# Patient Record
Sex: Male | Born: 1980 | Race: Black or African American | Hispanic: No
Health system: Midwestern US, Community
[De-identification: ages and names within clinical notes are randomized; demographics above are authoritative.]

## PROBLEM LIST (undated history)

## (undated) DIAGNOSIS — J45909 Unspecified asthma, uncomplicated: Secondary | ICD-10-CM

---

## 2018-07-02 LAB — POCT TROPONIN: POC Troponin I: 0 ng/mL (ref 0.00–0.08)

## 2019-12-18 LAB — CBC WITH AUTO DIFFERENTIAL
Absolute Baso #: 0 10*3/uL (ref 0.0–0.2)
Absolute Eos #: 0.2 10*3/uL (ref 0.0–0.5)
Absolute Lymph #: 1.8 10*3/uL (ref 1.0–3.2)
Absolute Mono #: 0.4 10*3/uL (ref 0.3–1.0)
Basophils %: 0.6 % (ref 0.0–2.0)
Eosinophils %: 3.3 % (ref 0.0–7.0)
Hematocrit: 42.5 % (ref 38.0–52.0)
Hemoglobin: 14.8 g/dL (ref 13.0–17.3)
Immature Grans (Abs): 0.01 10*3/uL (ref 0.00–0.06)
Immature Granulocytes: 0.2 % (ref 0.1–0.6)
Lymphocytes: 37 % (ref 15.0–45.0)
MCH: 32.7 pg (ref 27.0–34.5)
MCHC: 34.8 g/dL (ref 32.0–36.0)
MCV: 94 fL (ref 84.0–100.0)
MPV: 8.9 fL (ref 7.2–13.2)
Monocytes: 8.9 % (ref 4.0–12.0)
NRBC Absolute: 0 10*3/uL (ref 0.000–0.012)
NRBC Automated: 0 % (ref 0.0–0.2)
Neutrophils %: 50 % (ref 42.0–74.0)
Neutrophils Absolute: 2.5 10*3/uL (ref 1.6–7.3)
Platelets: 256 10*3/uL (ref 140–440)
RBC: 4.52 x10e6/mcL (ref 4.00–5.60)
RDW: 12.4 % (ref 11.0–16.0)
WBC: 4.9 10*3/uL (ref 3.8–10.6)

## 2019-12-18 LAB — BASIC METABOLIC PANEL
Anion Gap: 13 mmol/L (ref 2–17)
BUN: 8 mg/dL (ref 6–20)
CO2: 21 mmol/L — ABNORMAL LOW (ref 22–29)
Calcium: 8.8 mg/dL (ref 8.6–10.0)
Chloride: 108 mmol/L — ABNORMAL HIGH (ref 98–107)
Creatinine: 0.8 mg/dL (ref 0.7–1.3)
GFR African American: 131 mL/min/{1.73_m2} (ref >=90)
GFR Non-African American: 113 mL/min/{1.73_m2} (ref >=90)
Glucose: 72 mg/dL (ref 70–99)
OSMOLALITY CALCULATED: 280 mOsm/kg (ref 270–287)
Potassium: 3.9 mmol/L (ref 3.5–5.3)
Sodium: 142 mmol/L (ref 135–145)

## 2019-12-18 LAB — POC URINALYSIS, CHEMISTRY
Blood, UA POC: NEGATIVE
Glucose, UA POC: NEGATIVE mg/dL
Leukocytes, UA: NEGATIVE
Nitrate, UA POC: NEGATIVE
Specific Gravity, Urine, POC: >=1.03 — AB (ref 1.003–1.035)
UROBILIN U POC: 1 EU/dL
pH, Urine, POC: 5.5 (ref 4.5–8.0)

## 2019-12-18 LAB — HEPATIC FUNCTION PANEL
ALT: 41 U/L (ref 0–41)
AST: 40 U/L (ref 0–40)
Albumin: 4.7 g/dL (ref 3.5–5.2)
Alk Phosphatase: 52 U/L (ref 40–130)
Bilirubin, Direct: <0.2 mg/dL (ref 0.00–0.30)
Total Bilirubin: 0.4 mg/dL (ref 0.00–1.20)
Total Protein: 7.6 g/dL (ref 6.4–8.3)

## 2019-12-18 LAB — LIPASE: Lipase: 19 U/L (ref 13–60)

## 2019-12-18 NOTE — ED Provider Notes (Signed)
General Medical Problem *ED        Patient:   Lucas Bird, Lucas Bird             MRN: [id number]            FIN: [id number]               Age:   39 years     Sex:  Male     DOB:  [birth year]   Associated Diagnoses:   Abdominal pain; Alcoholic gastritis   Author:   Zeinab Rodwell-MD,  Shari Natt      Basic Information   Additional information: Chief Complaint from Nursing Triage Note   Chief Complaint  Chief Complaint: pt c/o burning abd pain radiating to the chest starting last night. denies n/v/d (12/18/19 09:05:00).      History of Present Illness   This is a 39 year old male with chief complaint of abdominal pain.  Patient states he is having some burning abdominal pain epigastric into his chest that started last night.  No vomiting.  No back pain.  No true chest pain.  No shortness of breath.  No fever.  He is somewhat of a drinker.  No abdominal surgeries.  No medicines..        Review of Systems   Constitutional symptoms:  No fever,    Respiratory symptoms:  No shortness of breath,    Cardiovascular symptoms:  No chest pain,    Gastrointestinal symptoms:  Abdominal pain, epigastric, burning.              Additional review of systems information: All other systems reviewed and otherwise negative.      Health Status   Allergies:    Allergic Reactions (Selected)  No Known Medication Allergies.      Past Medical/ Family/ Social History   Medical history: Reviewed as documented in chart.   Surgical history:    None ([id number])., Reviewed as documented in chart.   Family history: Reviewed as documented in chart.   Social history:    Social & Psychosocial Habits    Tobacco  11/04/2016  Use: Current every day smoker  , Reviewed as documented in chart.   Problem list:    Active Problems (1)  No Chronic Problems   .      Physical Examination               Vital Signs   Vital Signs   12/18/2019 9:08 EST Systolic Blood Pressure 119 mmHg    Diastolic Blood Pressure 85 mmHg    Peripheral Pulse Rate 66 bpm    Heart Rate Monitored 66 bpm     Respiratory Rate 20 br/min    Mean Arterial Pressure, Cuff 98 mmHg    SpO2 100 %   12/18/2019 9:05 EST Systolic Blood Pressure 119 mmHg    Diastolic Blood Pressure 85 mmHg    Temperature Oral 36.5 degC    Heart Rate Monitored 61 bpm    Respiratory Rate 18 br/min    SpO2 100 %   .   Measurements   12/18/2019 9:08 EST Body Mass Index est meas 19.44 kg/m2    Body Mass Index Measured 19.44 kg/m2   12/18/2019 9:05 EST Height/Length Measured 180 cm    Weight Dosing 63 kg   .   Basic Oxygen Information   12/18/2019 9:08 EST SpO2 100 %   12/18/2019 9:05 EST Oxygen Therapy Room air    SpO2 100 %   .   General:  Alert, no acute distress.    Skin:  Warm, dry, pink.    Head:  Normocephalic.   Neck:  Supple, trachea midline.    Eye:  Pupils are equal, round and reactive to light, extraocular movements are intact.    Ears, nose, mouth and throat:  Oral mucosa moist.   Cardiovascular:  Regular rate and rhythm.   Respiratory:  Lungs are clear to auscultation, respirations are non-labored.    Back:  Normal range of motion.   Musculoskeletal:  Normal ROM, no tenderness.    Chest wall   Gastrointestinal:  Soft, Non distended, Mild epigastric tenderness.  No rebound or guarding..    Neurological:  Alert and oriented to person, place, time, and situation, No focal neurological deficit observed, normal motor observed.    Lymphatics:  No lymphadenopathy.   Psychiatric:  Cooperative, appropriate mood & affect.       Medical Decision Making   Electrocardiogram:  Emergency Provider interpretation performed by me, normal sinus rhythm, No ST-T changes.       Reexamination/ Reevaluation   Vital signs   Basic Oxygen Information   12/18/2019 9:08 EST SpO2 100 %   12/18/2019 9:05 EST Oxygen Therapy Room air    SpO2 100 %      Notes: Patient feels better.  He is resting comfortably.  Repeat abdominal exam soft nontender.  He is advised to avoid excessive alcohol intake..      Impression and Plan   Diagnosis   Abdominal pain (ICD10-CM R10.9, Discharge,  Medical)   Alcoholic gastritis (YWV37-TG K29.20, Discharge, Medical)   Plan   Condition: Improved, Stable.    Disposition: Discharged: time  12/18/2019 11:59:00.    Prescriptions: Launch prescriptions   Pharmacy:  Pepcid 20 mg oral tablet (Prescribe): 20 mg, 1 tabs, Oral, BID, for 7 days, 14 tabs, 0 Refill(s).    Patient was given the following educational materials: Gastritis, Adult, Easy-to-Read, Gastritis, Adult, Easy-to-Read.    Follow up with: Follow up with primary care provider Within 1 week Return to ED if symptoms worsen.    Counseled: Patient, Regarding diagnosis, Regarding diagnostic results, Regarding treatment plan, Regarding prescription, Patient indicated understanding of instructions.    Signature Line     Electronically Signed on 12/18/2019 12:00 PM EST   ________________________________________________   Nalaya Wojdyla-MD,  Hannalee Castor               Modified by: Cheryllynn Sarff-MD,  Jabari Swoveland on 12/18/2019 12:00 PM EST

## 2019-12-18 NOTE — ED Notes (Signed)
ED Triage Note       ED Secondary Triage Entered On:  12/18/2019 9:08 EST    Performed On:  12/18/2019 9:08 EST by Ladona Ridgel, RN, JENNIFER S               General Information   Barriers to Learning :   None evident   ED Home Meds Section :   Document assessment   [facility] ED Fall Risk Section :   Document assessment   ED History Section :   Document assessment   ED Advance Directives Section :   Document assessment   ED Palliative Screen :   N/A (prefilled for <65yo)   Ladona Ridgel, RN, JENNIFER S - 12/18/2019 9:08 EST   (As Of: 12/18/2019 09:08:52 EST)   Problems(Active)    No Chronic Problems (Cerner  :NKP )  Name of Problem:   No Chronic Problems ; Recorder:   Sandre Kitty, RN, Johnney Killian; Code:   NKP ; Last Updated:   11/04/2016 14:16 EST ; Life Cycle Date:   11/04/2016 ; Life Cycle Status:   Active ; Vocabulary:   Cerner          Diagnoses(Active)    Abdominal pain  Date:   12/18/2019 ; Diagnosis Type:   Reason For Visit ; Confirmation:   Complaint of ; Clinical Dx:   Abdominal pain ; Classification:   Medical ; Clinical Service:   Emergency medicine ; Code:   PNED ; Probability:   0 ; Diagnosis Code:   [id number]             -    Procedure History   (As Of: 12/18/2019 09:08:52 EST)     Anesthesia Minutes:   0 ; Procedure Name:   None ; Procedure Minutes:   0            [facility] Fall Risk Assessment Tool   Hx of falling last 3 months ED Fall :   No   Laruth Bouchard - 12/18/2019 9:08 EST   ED Advance Directive   Advance Directive :   No   Ladona Ridgel RN, JENNIFER S - 12/18/2019 9:08 EST   Social History   Social History   (As Of: 12/18/2019 09:08:52 EST)   Tobacco:        Current every day smoker   (Last Updated: 11/04/2016 14:16:39 EST by Sandre Kitty, RN, ANDREA J)            Med Hx   Medication List   (As Of: 12/18/2019 09:08:52 EST)

## 2019-12-18 NOTE — ED Notes (Signed)
ED Patient Education Note     Patient Education Materials Follows:  Easy-to-Read     Gastritis, Adult    Gastritis is soreness and puffiness (inflammation) of the lining of the stomach. If you do not get help, gastritis can cause bleeding and sores (ulcers) in the stomach.      HOME CARE     Only take medicine as told by your doctor.     If you were given antibiotic medicines, take them as told. Finish the medicines even if you start to feel better.      Drink enough fluids to keep your pee (urine) clear or pale yellow.      Avoid foods and drinks that make your problems worse. Foods you may want to avoid include:    ? Caffeine or alcohol.    ? Chocolate.    ? Mint.    ? Garlic and onions.    ? Spicy foods.    ? Citrus fruits, including oranges, lemons, or limes.    ? Food containing tomatoes, including sauce, chili, salsa, and pizza.    ? Fried and fatty foods.     Eat small meals throughout the day instead of large meals.    GET HELP RIGHT AWAY IF:     You have black or dark red poop (stools).     You throw up (vomit) blood. It may look like coffee grounds.     You cannot keep fluids down.     Your belly (abdominal) pain gets worse.     You have a fever.     You do not feel better after 1 week.      You have any other questions or concerns.    MAKE SURE YOU:     Understand these instructions.      Will watch your condition.     Will get help right away if you are not doing well or get worse.    This information is not intended to replace advice given to you by your health care provider. Make sure you discuss any questions you have with your health care provider.    Document Released: 03/31/2008 Document Revised: 01/05/2012 Document Reviewed: 11/26/2011  Elsevier Interactive Patient Education ?2016 Elsevier Inc.

## 2019-12-18 NOTE — ED Notes (Signed)
ED Patient Education Note     Patient Education Materials Follows:  Easy-to-Read     Gastritis, Adult    Gastritis is soreness and puffiness (inflammation) of the lining of the stomach. If you do not get help, gastritis can cause bleeding and sores (ulcers) in the stomach.      HOME CARE     Only take medicine as told by your doctor.     If you were given antibiotic medicines, take them as told. Finish the medicines even if you start to feel better.      Drink enough fluids to keep your pee (urine) clear or pale yellow.      Avoid foods and drinks that make your problems worse. Foods you may want to avoid include:    ? Caffeine or alcohol.    ? Chocolate.    ? Mint.    ? Garlic and onions.    ? Spicy foods.    ? Citrus fruits, including oranges, lemons, or limes.    ? Food containing tomatoes, including sauce, chili, salsa, and pizza.    ? Fried and fatty foods.     Eat small meals throughout the day instead of large meals.    GET HELP RIGHT AWAY IF:     You have black or dark red poop (stools).     You throw up (vomit) blood. It may look like coffee grounds.     You cannot keep fluids down.     Your belly (abdominal) pain gets worse.     You have a fever.     You do not feel better after 1 week.      You have any other questions or concerns.    MAKE SURE YOU:     Understand these instructions.      Will watch your condition.     Will get help right away if you are not doing well or get worse.    This information is not intended to replace advice given to you by your health care provider. Make sure you discuss any questions you have with your health care provider.    Document Released: 03/31/2008 Document Revised: 01/05/2012 Document Reviewed: 11/26/2011  Elsevier Interactive Patient Education ?2016 Elsevier Inc.

## 2019-12-18 NOTE — ED Notes (Signed)
ED Triage Note       ED Triage Adult Entered On:  12/18/2019 9:08 EST    Performed On:  12/18/2019 9:05 EST by Lovena Le, RN, JENNIFER S               Triage   Chief Complaint :   pt c/o burning abd pain radiating to the chest starting last night. denies n/v/d   Numeric Rating Pain Scale :   8   [place] Mode of Arrival :   Private vehicle   Infectious Disease Documentation :   Document assessment   Temperature Oral :   36.5 degC(Converted to: 97.7 degF)    Heart Rate Monitored :   61 bpm   Respiratory Rate :   18 br/min   Systolic Blood Pressure :   601 mmHg   Diastolic Blood Pressure :   85 mmHg   SpO2 :   100 %   Oxygen Therapy :   Room air   Patient presentation :   None of the above   Chief Complaint or Presentation suggest infection :   No   Weight Dosing :   63 kg(Converted to: 138 lb 14 oz)    Height :   180 cm(Converted to: 5 ft 11 in)    Body Mass Index Dosing :   19 kg/m2   Lovena Le RN, [place] S - 12/18/2019 9:05 EST   DCP GENERIC CODE   Tracking Acuity :   3   Tracking Group :   ED [employer], RN, [employer] S - 12/18/2019 9:05 EST   ED General Section :   Document assessment   Pregnancy Status :   N/A   ED Allergies Section :   Document assessment   ED Reason for Visit Section :   Document assessment   ED Quick Assessment :   Patient appears awake, alert, oriented to baseline. Skin warm and dry. Moves all extremities. Respiration even and unlabored. Appears in no apparent distress.   Lovena Le, RN, [place] S - 12/18/2019 9:05 EST   ID Risk Screen Symptoms   Recent Travel History :   No recent travel   Close Contact with COVID-19 ID :   No   Last 14 days COVID-19 ID :   Yes - Not Detected (negative)   TAYLOR, RN, JENNIFER S - 12/18/2019 9:05 EST   Allergies   (As Of: 12/18/2019 09:08:17 EST)   Allergies (Active)   No Known Medication Allergies  Estimated Onset Date:   Unspecified ; Created ByRebeca Alert, RN, Maralyn Sago; Reaction Status:   Active ; Category:   Drug ; Substance:   No Known Medication  Allergies ; Type:   Allergy ; Updated By:   Acquanetta Chain; Reviewed Date:   07/02/2018 16:58 EDT        Psycho-Social   Last 3 mo, thoughts killing self/others :   Patient denies   Unknown Foley - 12/18/2019 9:05 EST   ED Reason for Visit   (As Of: 12/18/2019 09:08:17 EST)   Problems(Active)    No Chronic Problems (Cerner  :NKP )  Name of Problem:   No Chronic Problems ; Recorder:   Rebeca Alert, RN, Maralyn Sago; Code:   NKP ; Last Updated:   11/04/2016 14:16 EST ; Life Cycle Date:   11/04/2016 ; Life Cycle Status:   Active ; Vocabulary:   Cerner  Diagnoses(Active)    Abdominal pain  Date:   12/18/2019 ; Diagnosis Type:   Reason For Visit ; Confirmation:   Complaint of ; Clinical Dx:   Abdominal pain ; Classification:   Medical ; Clinical Service:   Emergency medicine ; Code:   PNED ; Probability:   0 ; Diagnosis Code:   [id number]

## 2019-12-18 NOTE — ED Notes (Signed)
 ED Patient Summary       ;       [facility] Emergency Department  [address] [place], [place] [address]  [phone]  Discharge Instructions (Patient)  _______________________________________     Name: Lucas Bird, Lucas Bird  DOB:  12/16/1980                   MRN: [id number]                   FIN: [id number]  Reason For Visit: Abdominal pain; CHEST/ABD PAIN  Final Diagnosis: Abdominal pain; Alcoholic gastritis     Visit Date: 12/18/2019 08:57:00  Address: [address] [place] [address]  Phone: [phone]     Emergency Department Providers:         Primary Physician:   [facility] would like to thank you for allowing us  to assist you with your healthcare needs. The following includes patient education materials and information regarding your injury/illness.     Follow-up Instructions:  You were seen today on an emergency basis. Please contact your primary care doctor for a follow up appointment. If you received a referral to a specialist doctor, it is important you follow-up as instructed.    It is important that you call your follow-up doctor to schedule and confirm the location of your next appointment. Your doctor may practice at multiple locations. The office location of your follow-up appointment may be different to the one written on your discharge instructions.    If you do not have a primary care doctor, please call [phone] for help in finding a [facility] Provider. For help in finding a specialist doctor, please call [phone].    The [facility] "Ask a Nurse" line in staffed by Registered Nurses and is a free service to the community. We are available Monday - Friday from 8am to 5pm to answer your questions about your health. Please call [phone].    If your condition gets worse before your follow-up with your primary care doctor or specialist, please return to the Emergency  Department.      Coronavirus 2019 (COVID-19) Reminders:     Patients 65 and older can contact their [facility] doctors' offices to schedule appointments to receive the COVID-19 vaccine at the [facility] or send us  an email at [email or web address].            Scan this code with your phone camera to send an email to the address above.      Patients who are 78 and older who do not have a [facility] physician can call [phone] to schedule vaccination appointments.         Follow Up Appointments:  Primary Care Provider:      Name: PCP,  NONE      Phone:                  With: Address: When:   Follow up with primary care provider  Within 1 week   Comments:   Return to ED if symptoms worsen              Printed Prescriptions:    Patient Education Materials:  Discharge Orders          Discharge Patient 12/18/19 12:00:00 EST         Comment:  Gastritis, Adult, Easy-to-Read     Gastritis, Adult    Gastritis is soreness and puffiness (inflammation) of the lining of the stomach. If you do not get help, gastritis can cause bleeding and sores (ulcers) in the stomach.      HOME CARE     Only take medicine as told by your doctor.     If you were given antibiotic medicines, take them as told. Finish the medicines even if you start to feel better.      Drink enough fluids to keep your pee (urine) clear or pale yellow.      Avoid foods and drinks that make your problems worse. Foods you may want to avoid include:    ? Caffeine or alcohol.    ? Chocolate.    ? Mint.    ? Garlic and onions.    ? Spicy foods.    ? Citrus fruits, including oranges, lemons, or limes.    ? Food containing tomatoes, including sauce, chili, salsa, and pizza.    ? Fried and fatty foods.     Eat small meals throughout the day instead of large meals.    GET HELP RIGHT AWAY IF:     You have black or dark red poop (stools).     You throw up (vomit) blood. It may look like coffee grounds.     You cannot keep  fluids down.     Your belly (abdominal) pain gets worse.     You have a fever.     You do not feel better after 1 week.      You have any other questions or concerns.    MAKE SURE YOU:     Understand these instructions.      Will watch your condition.     Will get help right away if you are not doing well or get worse.    This information is not intended to replace advice given to you by your health care provider. Make sure you discuss any questions you have with your health care provider.    Document Released: 03/31/2008 Document Revised: 01/05/2012 Document Reviewed: 11/26/2011  Elsevier Interactive Patient Education ?2016 Elsevier Inc.         Allergy Info: No Known Medication Allergies     Medication Information:  [facility] ED Physicians provided you with a complete list of medications post discharge, if you have been instructed to stop taking a medication please ensure you also follow up with this information to your Primary Care Physician.  Unless otherwise noted, patient will continue to take medications as prescribed prior to the Emergency Room visit.  Any specific questions regarding your chronic medications and dosages should be discussed with your physician(s) and pharmacist.          famotidine (Pepcid 20 mg oral tablet) 1 Tabs Oral (given by mouth) 2 times a day for 7 Days. Refills: 0.      Medications Administered During Visit:              Medication Dose Route   Sodium Chloride 0.9% 1000 mL IV Piggyback   pantoprazole 40 mg IV Push   lidocaine topical 15 mL Oral   Al hydroxide/Mg hydroxide/simethicone 30 mL Oral   metoclopramide 10 mg IV Push          Major Tests and Procedures:  The following procedures and tests were performed during your ED visit.  COMMON PROCEDURES%>  COMMON PROCEDURES COMMENTS%>  Laboratory Orders  Name Status Details   .UA POC Completed Urine, RT, RT - Routine, Collected, 12/18/19 9:47:00 EST, Nurse collect, 12/18/19 9:47:00 US Robinette, RAL POC Login   BMP  Completed Blood, Stat, ST - Stat, 12/18/19 9:20:00 EST, 12/18/19 9:20:00 EST, Nurse collect, DALU-MD,  DAVID, Print label Y/N   CBCDIFF Completed Blood, Stat, ST - Stat, 12/18/19 9:20:00 EST, 12/18/19 9:20:00 EST, Nurse collect, DALU-MD,  DAVID, Print label Y/N   Hepatic Completed Blood, Stat, ST - Stat, 12/18/19 9:20:00 EST, 12/18/19 9:20:00 EST, Nurse collect, DALU-MD,  DAVID, Print label Y/N   Lipase Lvl Completed Blood, Stat, ST - Stat, 12/18/19 9:20:00 EST, 12/18/19 9:20:00 EST, Nurse collect, DALU-MD,  DAVID, Print label Y/N   XTube Blue Completed Blood, Stat, ST - Stat, Collected, 12/18/19 9:24:00 EST [id number], 12/18/19 9:24:00 EST, Nurse collect, Venous Draw, 12/18/19 9:33:00 EST, RH CP Login, DALU-MD,  DAVID, Print label Y/N, rh_accession_1, 1.8 mL Aolz/*[id number]*/, Complete               Radiology Orders  Name Status Details   XR Chest 1 View Portable Completed 12/18/19 10:49:00 EST, STAT 1 hour or less, Reason: Chest pain, Transport Mode: Portable, pp_set_radiology_subspecialty               Patient Care Orders  Name Status Details   Discharge Patient Ordered 12/18/19 12:00:00 EST   ED Assessment Adult Completed 12/18/19 9:08:18 EST, 12/18/19 9:08:18 EST   ED Secondary Triage Completed 12/18/19 9:08:18 EST, 12/18/19 9:08:18 EST   ED Triage Adult Completed 12/18/19 8:57:53 EST, 12/18/19 8:57:53 EST   POC-Urine Dipstick collect Completed 12/18/19 9:20:00 EST, Once, 12/18/19 9:20:00 EST   Saline Lock Insert Completed 12/18/19 9:20:00 EST, Once, 12/18/19 9:20:00 EST       ---------------------------------------------------------------------------------------------------------------------  [facility] encourages you to self-enroll in the [facility] Patient Portal.  [facility] Patient Portal will allow you to manage your personal health information securely from your own electronic device now and in the future.  To begin your Patient Portal enrollment process, please visit  [email or web address] Click on "Sign up now" under [facility].  If you find that you need additional assistance on the [facility] Patient Portal or need a copy of your medical records, please call the [facility] Medical Records Office at [phone].  Comment:

## 2019-12-18 NOTE — Discharge Summary (Signed)
ED Clinical Summary                        [facility]  [address]  [place], [place] [address]  [phone]           PERSON INFORMATION  Name: Lucas Bird, Lucas Bird Age:  39 Years DOB: 1980-11-20   Sex: Male Language: English PCP: PCP,  NONE   Marital Status: Single Phone: [phone] Med Service: MED-Medicine   MRN: [id number] Acct# [id number] Arrival: 12/18/2019 08:57:00   Visit Reason: Abdominal pain; CHEST/ABD PAIN Acuity: 3 LOS: 000 03:09   Address:    [address] [place] [address]   Diagnosis:    Abdominal pain; Alcoholic gastritis  Medications:          New Medications  Printed Prescriptions  famotidine (Pepcid 20 mg oral tablet) 1 Tabs Oral (given by mouth) 2 times a day for 7 Days. Refills: 0.  Last Dose:____________________      Medications Administered During Visit:                Medication Dose Route   Sodium Chloride 0.9% 1000 mL IV Piggyback   pantoprazole 40 mg IV Push   lidocaine topical 15 mL Oral   Al hydroxide/Mg hydroxide/simethicone 30 mL Oral   metoclopramide 10 mg IV Push               Allergies      No Known Medication Allergies      Major Tests and Procedures:  The following procedures and tests were performed during your ED visit.  COMMON PROCEDURES%>  COMMON PROCEDURES COMMENTS%>                PROVIDER INFORMATION               Provider Role Assigned Daymon Larsen, DAVID ED Provider 12/18/2019 08:58:30    Ladona Ridgel, RN, Richarda Osmond ED Nurse 12/18/2019 09:10:41        Attending Physician:  DALU-MD,  DAVID      Admit Doc  DALU-MD,  DAVID     Consulting Doc       VITALS INFORMATION  Vital Sign Triage Latest   Temp Oral ORAL_1%> ORAL%>   Temp Temporal TEMPORAL_1%> TEMPORAL%>   Temp Intravascular INTRAVASCULAR_1%> INTRAVASCULAR%>   Temp Axillary AXILLARY_1%> AXILLARY%>   Temp Rectal RECTAL_1%> RECTAL%>   02 Sat 100 % 100 %   Respiratory Rate RATE_1%> RATE%>   Peripheral Pulse Rate PULSE RATE_1%>66 bpm PULSE RATE%>   Apical Heart Rate HEART RATE_1%> HEART  RATE%>   Blood Pressure BLOOD PRESSURE_1%>/ BLOOD PRESSURE_1%>85 mmHg BLOOD PRESSURE%> / BLOOD PRESSURE%>78 mmHg                 Immunizations      No Immunizations Documented This Visit          DISCHARGE INFORMATION   Discharge Disposition: H Outpt-Sent Home   Discharge Location:  Home   Discharge Date and Time:  12/18/2019 12:06:15   ED Checkout Date and Time:  12/18/2019 12:06:15     DEPART REASON INCOMPLETE INFORMATION               Depart Action Incomplete Reason   Interactive View/I&O Recently assessed               Problems      Active           No Chronic Problems  Smoking Status      Current every day smoker         PATIENT EDUCATION INFORMATION  Instructions:     Gastritis, Adult, Easy-to-Read     Follow up:                   With: Address: When:   Follow up with primary care provider  Within 1 week   Comments:   Return to ED if symptoms worsen              ED PROVIDER DOCUMENTATION     Patient:   Lucas Bird, Lucas Bird             MRN: [id number]            FIN: [id number]               Age:   39 years     Sex:  Male     DOB:  03-30-1981   Associated Diagnoses:   Abdominal pain; Alcoholic gastritis   Author:   DALU-MD,  DAVID      Basic Information   Additional information: Chief Complaint from Nursing Triage Note   Chief Complaint  Chief Complaint: pt c/o burning abd pain radiating to the chest starting last night. denies n/v/d (12/18/19 09:05:00).      History of Present Illness   This is a 39 year old male with chief complaint of abdominal pain.  Patient states he is having some burning abdominal pain epigastric into his chest that started last night.  No vomiting.  No back pain.  No true chest pain.  No shortness of breath.  No fever.  He is somewhat of a drinker.  No abdominal surgeries.  No medicines..        Review of Systems   Constitutional symptoms:  No fever,    Respiratory symptoms:  No shortness of breath,    Cardiovascular symptoms:  No chest pain,    Gastrointestinal symptoms:  Abdominal  pain, epigastric, burning.              Additional review of systems information: All other systems reviewed and otherwise negative.      Health Status   Allergies:    Allergic Reactions (Selected)  No Known Medication Allergies.      Past Medical/ Family/ Social History   Medical history: Reviewed as documented in chart.   Surgical history:    None ([id number])., Reviewed as documented in chart.   Family history: Reviewed as documented in chart.   Social history:    Social & Psychosocial Habits    Tobacco  11/04/2016  Use: Current every day smoker  , Reviewed as documented in chart.   Problem list:    Active Problems (1)  No Chronic Problems   .      Physical Examination               Vital Signs   Vital Signs   12/18/2019 9:08 EST Systolic Blood Pressure 119 mmHg    Diastolic Blood Pressure 85 mmHg    Peripheral Pulse Rate 66 bpm    Heart Rate Monitored 66 bpm    Respiratory Rate 20 br/min    Mean Arterial Pressure, Cuff 98 mmHg    SpO2 100 %   12/18/2019 9:05 EST Systolic Blood Pressure 119 mmHg    Diastolic Blood Pressure 85 mmHg    Temperature Oral 36.5 degC    Heart Rate Monitored 61 bpm    Respiratory  Rate 18 br/min    SpO2 100 %   .   Measurements   12/18/2019 9:08 EST Body Mass Index est meas 19.44 kg/m2    Body Mass Index Measured 19.44 kg/m2   12/18/2019 9:05 EST Height/Length Measured 180 cm    Weight Dosing 63 kg   .   Basic Oxygen Information   12/18/2019 9:08 EST SpO2 100 %   12/18/2019 9:05 EST Oxygen Therapy Room air    SpO2 100 %   .   General:  Alert, no acute distress.    Skin:  Warm, dry, pink.    Head:  Normocephalic.   Neck:  Supple, trachea midline.    Eye:  Pupils are equal, round and reactive to light, extraocular movements are intact.    Ears, nose, mouth and throat:  Oral mucosa moist.   Cardiovascular:  Regular rate and rhythm.   Respiratory:  Lungs are clear to auscultation, respirations are non-labored.    Back:  Normal range of motion.   Musculoskeletal:  Normal ROM, no tenderness.    Chest  wall   Gastrointestinal:  Soft, Non distended, Mild epigastric tenderness.  No rebound or guarding..    Neurological:  Alert and oriented to person, place, time, and situation, No focal neurological deficit observed, normal motor observed.    Lymphatics:  No lymphadenopathy.   Psychiatric:  Cooperative, appropriate mood & affect.       Medical Decision Making   Electrocardiogram:  Emergency Provider interpretation performed by me, normal sinus rhythm, No ST-T changes.       Reexamination/ Reevaluation   Vital signs   Basic Oxygen Information   12/18/2019 9:08 EST SpO2 100 %   12/18/2019 9:05 EST Oxygen Therapy Room air    SpO2 100 %      Notes: Patient feels better.  He is resting comfortably.  Repeat abdominal exam soft nontender.  He is advised to avoid excessive alcohol intake..      Impression and Plan   Diagnosis   Abdominal pain (ICD10-CM R10.9, Discharge, Medical)   Alcoholic gastritis (IEP32-RJ K29.20, Discharge, Medical)   Plan   Condition: Improved, Stable.    Disposition: Discharged: time  12/18/2019 11:59:00.    Prescriptions: Launch prescriptions   Pharmacy:  Pepcid 20 mg oral tablet (Prescribe): 20 mg, 1 tabs, Oral, BID, for 7 days, 14 tabs, 0 Refill(s).    Patient was given the following educational materials: Gastritis, Adult, Easy-to-Read, Gastritis, Adult, Easy-to-Read.    Follow up with: Follow up with primary care provider Within 1 week Return to ED if symptoms worsen.    Counseled: Patient, Regarding diagnosis, Regarding diagnostic results, Regarding treatment plan, Regarding prescription, Patient indicated understanding of instructions.

## 2020-10-25 ENCOUNTER — Emergency Department (HOSPITAL_BASED_OUTPATIENT_CLINIC_OR_DEPARTMENT_OTHER): Payer: No Typology Code available for payment source

## 2020-10-25 ENCOUNTER — Other Ambulatory Visit: Payer: Self-pay

## 2020-10-25 ENCOUNTER — Emergency Department (HOSPITAL_BASED_OUTPATIENT_CLINIC_OR_DEPARTMENT_OTHER)
Admission: EM | Admit: 2020-10-25 | Discharge: 2020-10-25 | Disposition: A | Discharge status: Home / Self Care | Payer: No Typology Code available for payment source | Attending: Emergency Medicine | Admitting: Emergency Medicine

## 2020-10-25 ENCOUNTER — Encounter (HOSPITAL_BASED_OUTPATIENT_CLINIC_OR_DEPARTMENT_OTHER): Payer: Self-pay

## 2020-10-25 DIAGNOSIS — R519 Headache, unspecified: Secondary | ICD-10-CM | POA: Diagnosis not present

## 2020-10-25 DIAGNOSIS — Y92410 Unspecified street and highway as the place of occurrence of the external cause: Secondary | ICD-10-CM | POA: Insufficient documentation

## 2020-10-25 DIAGNOSIS — M5020 Other cervical disc displacement, unspecified cervical region: Secondary | ICD-10-CM | POA: Diagnosis not present

## 2020-10-25 DIAGNOSIS — F17210 Nicotine dependence, cigarettes, uncomplicated: Secondary | ICD-10-CM | POA: Diagnosis not present

## 2020-10-25 DIAGNOSIS — M542 Cervicalgia: Secondary | ICD-10-CM | POA: Diagnosis present

## 2020-10-25 DIAGNOSIS — M546 Pain in thoracic spine: Secondary | ICD-10-CM | POA: Insufficient documentation

## 2020-10-25 MED ORDER — LIDOCAINE 5 % EX PTCH: 1.0000 | MEDICATED_PATCH | CUTANEOUS | 0 refills | Status: DC

## 2020-10-25 MED ORDER — HYDROCODONE-ACETAMINOPHEN 5-325 MG PO TABS: 1.0000 | ORAL_TABLET | ORAL | 0 refills | Status: DC | PRN

## 2020-10-25 MED ORDER — HYDROCODONE-ACETAMINOPHEN 5-325 MG PO TABS
1.0000 | ORAL_TABLET | Freq: Once | ORAL | Status: AC
  Administered 2020-10-25: 17:00:00 1 via ORAL
  Filled 2020-10-25: qty 1

## 2020-10-25 NOTE — Discharge Instructions (Signed)
Take the pain medication as prescribed. Please be aware this medication may become addictive. Do not drive or operate emergency room while taking this medication.  Your CT scan of your neck did show a disc protrusion consistent with a herniated disc. I have talked with the neurosurgeon while you are here in the emergency department. They would like you to follow-up outpatient.  Return to emergency department for any worsening symptoms.
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
## 2021-01-13 ENCOUNTER — Emergency Department (HOSPITAL_BASED_OUTPATIENT_CLINIC_OR_DEPARTMENT_OTHER)
Admission: EM | Admit: 2021-01-13 | Discharge: 2021-01-13 | Disposition: A | Discharge status: Home / Self Care | Payer: Self-pay | Attending: Emergency Medicine | Admitting: Emergency Medicine

## 2021-01-13 ENCOUNTER — Encounter (HOSPITAL_BASED_OUTPATIENT_CLINIC_OR_DEPARTMENT_OTHER): Payer: Self-pay | Admitting: Emergency Medicine

## 2021-01-13 ENCOUNTER — Other Ambulatory Visit: Payer: Self-pay

## 2021-01-13 DIAGNOSIS — F17210 Nicotine dependence, cigarettes, uncomplicated: Secondary | ICD-10-CM | POA: Insufficient documentation

## 2021-01-13 DIAGNOSIS — R21 Rash and other nonspecific skin eruption: Secondary | ICD-10-CM

## 2021-01-13 DIAGNOSIS — L239 Allergic contact dermatitis, unspecified cause: Secondary | ICD-10-CM | POA: Insufficient documentation

## 2021-01-13 MED ORDER — PREDNISONE 20 MG PO TABS: 40.0000 mg | ORAL_TABLET | Freq: Every day | ORAL | 0 refills | Status: AC
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
## 2021-01-13 NOTE — ED Provider Notes (Signed)
[facility] EMERGENCY DEPARTMENT Provider Note   CSN: [id number] Arrival date & time: 01/13/21  1036     History Chief Complaint  Patient presents with  . Rash    Christopher Brewer is a 40 y.o. male.  40 y.o male with no PMH presents to the ED with a chief complaint of rash x 1 week. Patient states the rash began on his torso then spread onto his back, describing it as pruritic in nature. He does report using a new soap, which he has since discontinued. 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  He has taken benadryl which helped with the discomfort. He also tried hydrocodone cream from [facility] which helped relieve some of the discomfort.  He denies any systemic signs such as abdominal pain, oral involvement, nausea vomiting or other complaints. No prior allergy history.    The history is provided by the patient.  Rash Location:  Torso Torso rash location:  R chest Quality: burning, itchiness and redness   Quality: not painful   Severity:  Mild Duration:  1 week Timing:  Constant Chronicity:  New Context: new detergent/soap   Context: not exposure to similar rash, not medications and not plant contact   Relieved by:  Anti-itch cream and topical steroids Worsened by:  Contact Associated symptoms: no abdominal pain, no fever and no shortness of breath        History reviewed. No pertinent past medical history.  There are no problems to display for this patient.   History reviewed. No pertinent surgical history.     History reviewed. No pertinent family history.  Social History   Tobacco Use  . Smoking status: Current Every Day Smoker    Types: Cigarettes  Substance Use Topics  . Alcohol use: Yes    Comment: occ  . Drug use: Never    Home Medications Prior to Admission medications   Medication Sig Start Date End Date Taking? Authorizing Provider  predniSONE (DELTASONE) 20 MG tablet Take 2 tablets (40  mg total) by mouth daily for 5 days. 01/13/21 01/18/21 Yes Soto, Leonie Douglas, PA-C  HYDROcodone-acetaminophen (NORCO/VICODIN) 5-325 MG tablet Take 1 tablet by mouth every 4 (four) hours as needed. 10/25/20   Henderly, Britni A, PA-C  lidocaine (LIDODERM) 5 % Place 1 patch onto the skin daily. Remove & Discard patch within 12 hours or as directed by MD 10/25/20   Henderly, Britni A, PA-C    Allergies    Patient has no known allergies.  Review of Systems   Review of Systems  Constitutional: Negative for fever.  Respiratory: Negative for shortness of breath.   Cardiovascular: Negative for chest pain.  Gastrointestinal: Negative for abdominal pain.  Skin: Positive for rash.    Physical Exam Updated Vital Signs BP 131/76 (BP Location: Right Arm)   Pulse 64   Temp 97.8 F (36.6 C) (Oral)   Resp 16   Ht 5\' 10"  (1.778 m)   Wt 70.3 kg   SpO2 100%   BMI 22.24 kg/m   Physical Exam Vitals and nursing note reviewed.  Constitutional:      Appearance: Normal appearance.  HENT:     Head: Normocephalic and atraumatic.     Nose: Nose normal.     Mouth/Throat:     Mouth: Mucous membranes are moist.     Comments: No oral involvement.  Eyes:     Pupils: Pupils are equal, round, and  reactive to light.  Cardiovascular:     Rate and Rhythm: Normal rate.  Pulmonary:     Effort: Pulmonary effort is normal.  Abdominal:     General: Abdomen is flat.     Tenderness: There is no abdominal tenderness.  Musculoskeletal:     Cervical back: Normal range of motion and neck supple.  Skin:    General: Skin is warm and dry.     Findings: Rash present.          Comments: Scaling with visible borders, no erythema noted. No vesicular lesions.   Neurological:     Mental Status: He is alert and oriented to person, place, and time.     ED Results / Procedures / Treatments   Labs (all labs ordered are listed, but only abnormal results are displayed) Labs Reviewed - No data to  display  EKG None  Radiology No results found.  Procedures Procedures   Medications Ordered in ED Medications - No data to display  ED Course  I have reviewed the triage vital signs and the nursing notes.  Pertinent labs & imaging results that were available during my care of the patient were reviewed by me and considered in my medical decision making (see chart for details).    MDM Rules/Calculators/A&P     Patient presents to the ED with a chief complaint of pruritic rash to the torso, this began about a week ago.  He has tried over-the-counter hydrocortisone cream, Benadryl, with some improvement in his symptoms.  He did start using any supple chest since discontinued.  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  Reports he has not come in contact with anything that he knows may be causing this.  There is no rash present to his extremities, no oral involvement.  He does not have any systemic signs such as fever, abdominal pain, nausea or vomiting.   Describes the rash as pruritic in nature, and painful as he has been scratching this constantly. Scaling to torso and back. Rash crosses the midline without any vesicles doubt shingles, no oral involvement. No systemic signs, we discussed treatment with steroids, risk and benefits discussed. We also discussed allergen journal. Return precautions discussed at length.     Portions of this note were generated with Scientist, clinical (histocompatibility and immunogenetics). Dictation errors may occur despite best attempts at proofreading.  Final Clinical Impression(s) / ED Diagnoses Final diagnoses:  Rash  Allergic contact dermatitis, unspecified trigger    Rx / DC Orders ED Discharge Orders         Ordered    predniSONE (DELTASONE) 20 MG tablet  Daily        01/13/21 1117           Claude Manges, PA-C 01/13/21 1131    Alvira Monday, MD 01/15/21 [phone]

## 2021-01-13 NOTE — Discharge Instructions (Addendum)
I have provided with a prescription for steroids, please take 2 tablets daily for the next 5 days.   Please be aware of anything you come in contact with which may be causing your rash.   If  you experience any oral involvement, abdominal pain or other symptoms please return to the D.

## 2021-09-06 IMAGING — CT CT T SPINE W/O CM
3 series · 10 of 33 positions shown, 12 images · non-contrast
Comparison: None.

CLINICAL DATA: Back trauma

EXAM:
CT THORACIC SPINE WITHOUT CONTRAST
TECHNIQUE: Multidetector CT images of the thoracic were obtained using the
standard protocol without intravenous contrast.

[Series 3: t spine soft · axial · 0.30mm/px · z∈[-1168,-1044]mm · 2 of 136 slices shown, 3 images]
[im 42/136  soft-tissue]
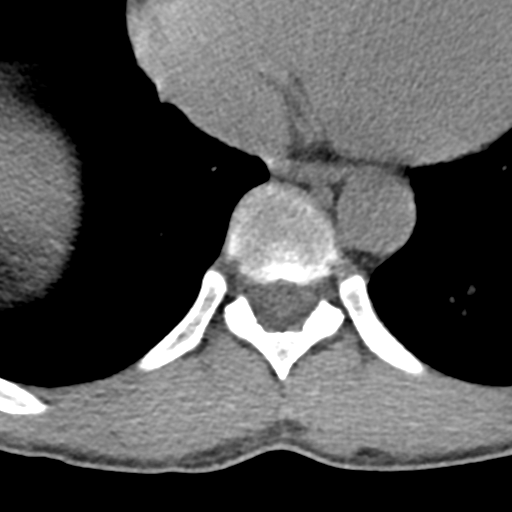
[im 42/136  bone]
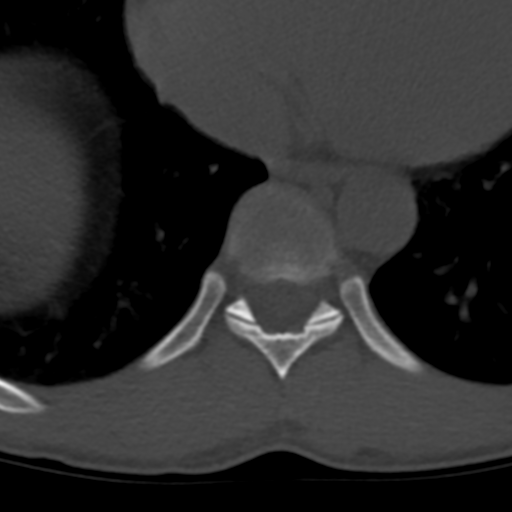
[im 104/136  bone]
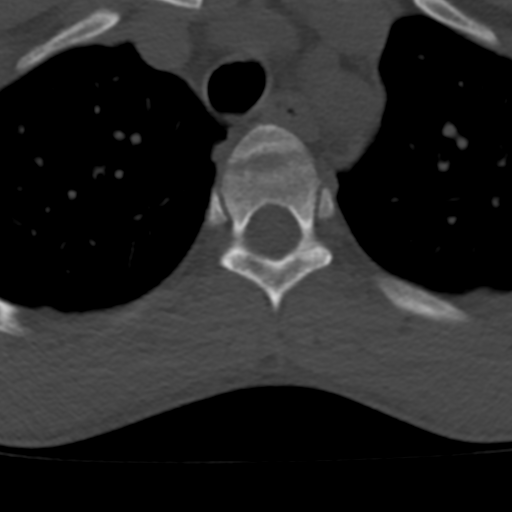

[Series 4: sagittal bone · sagittal · 0.29mm/px · 5 of 58 slices shown, 6 images]
[im 20/58  bone]
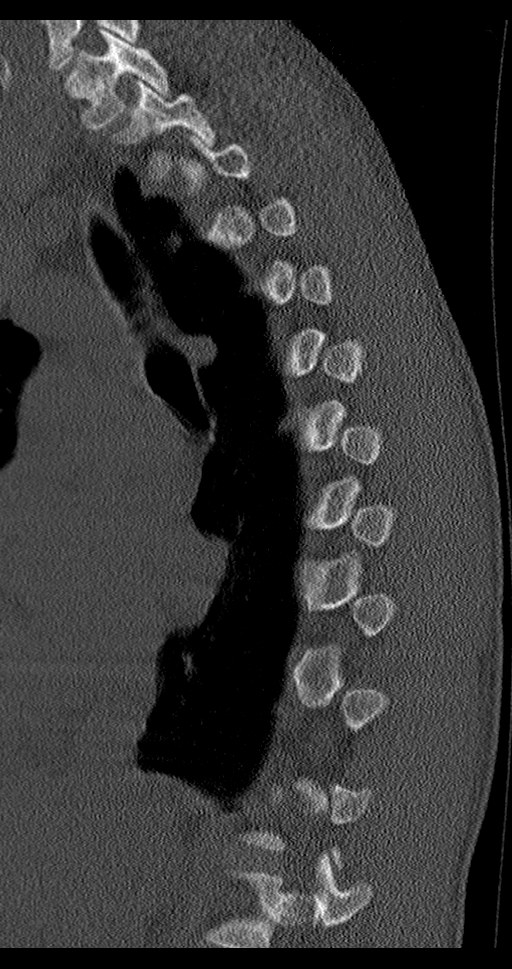
[im 24/58  bone]
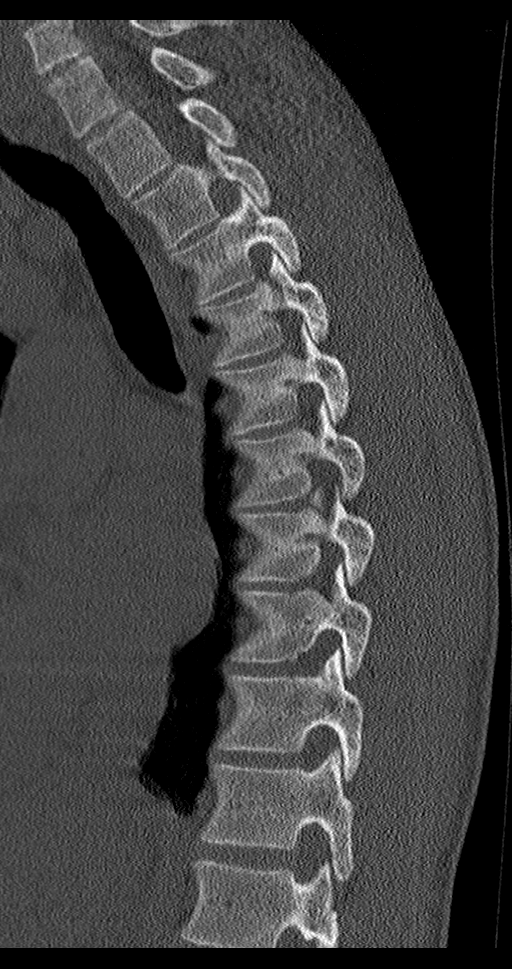
[im 29/58  soft-tissue]
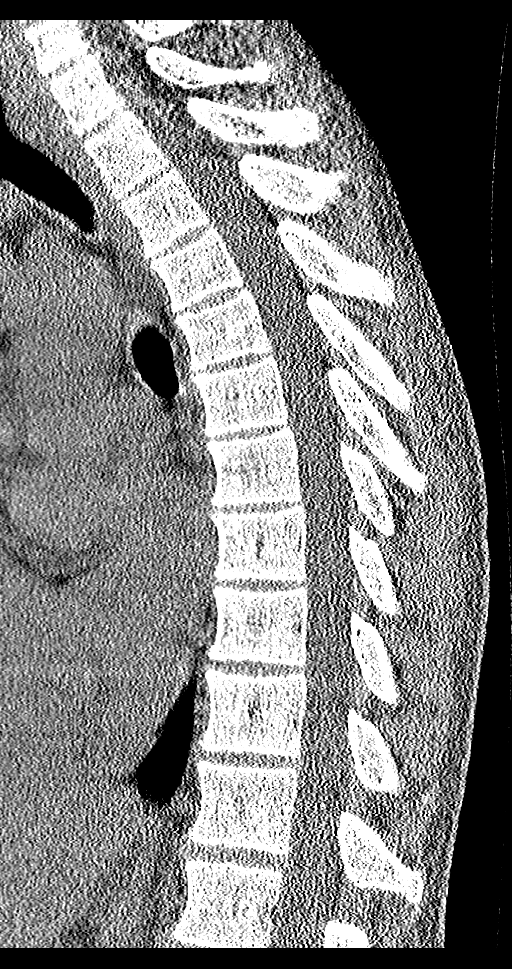
[im 29/58  bone]
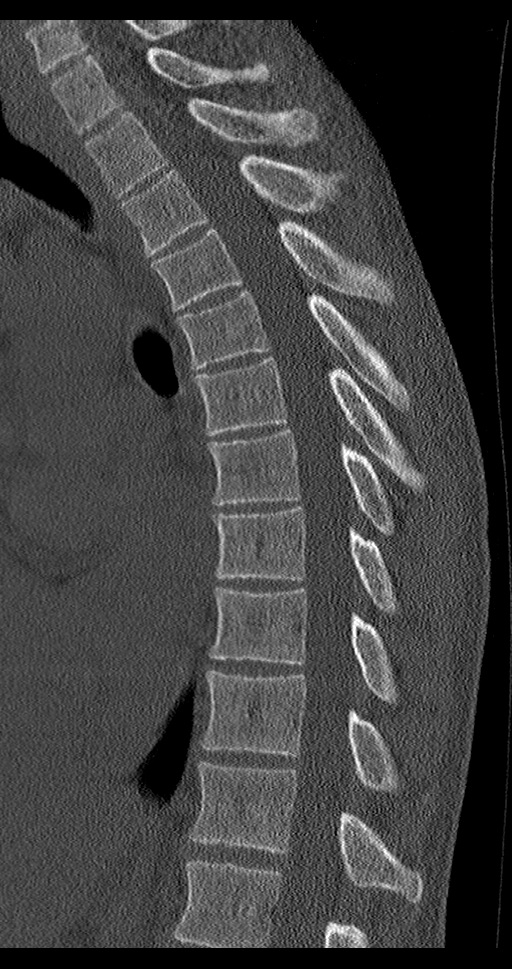
[im 34/58  bone]
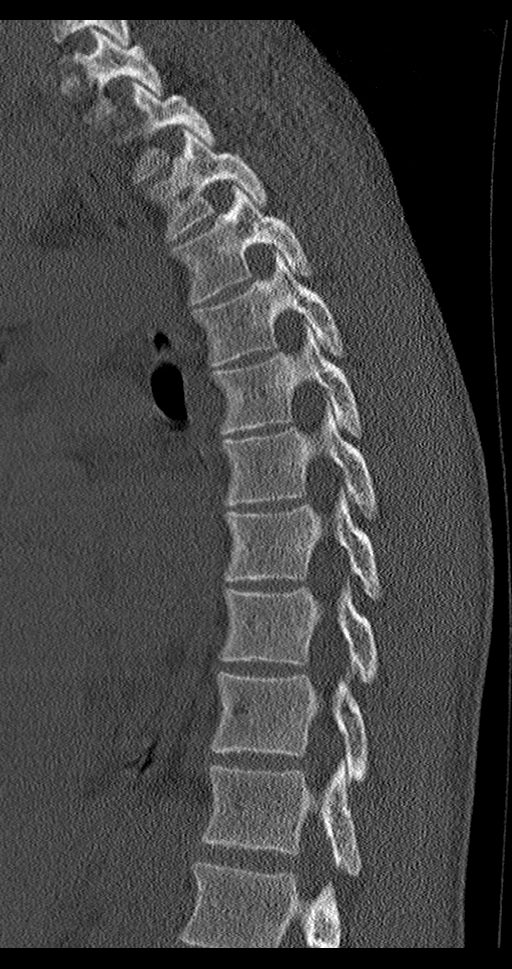
[im 39/58  bone]
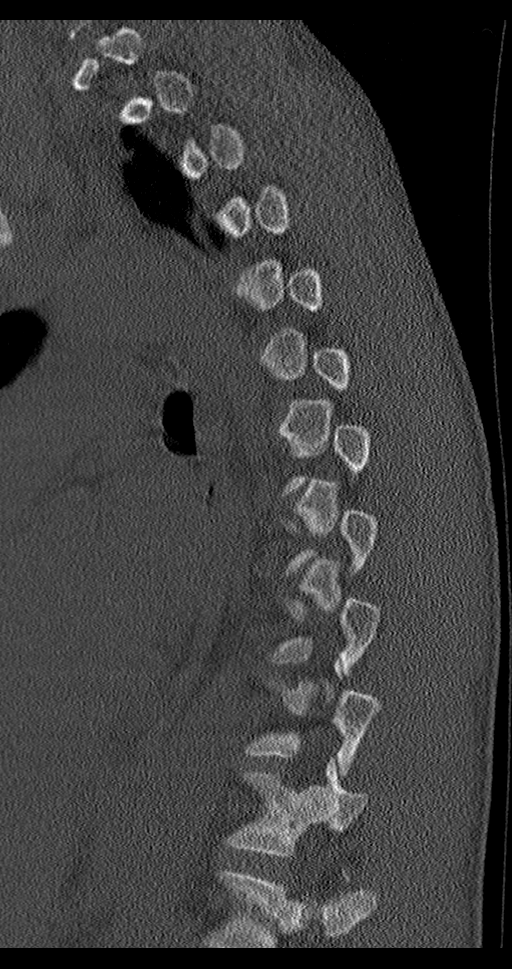

[Series 5: coronal bone · coronal · 0.27mm/px · 3 of 65 slices shown]
[im 13/65  bone]
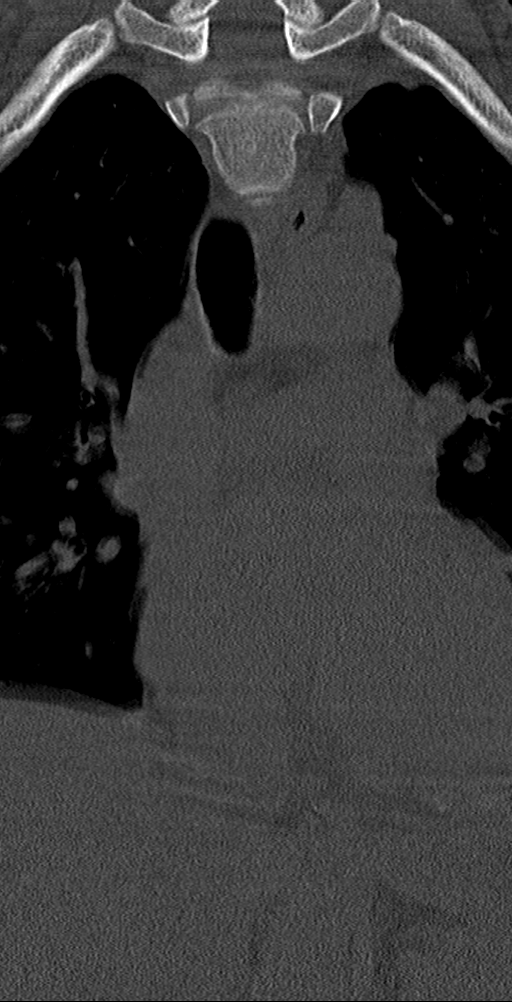
[im 26/65  bone]
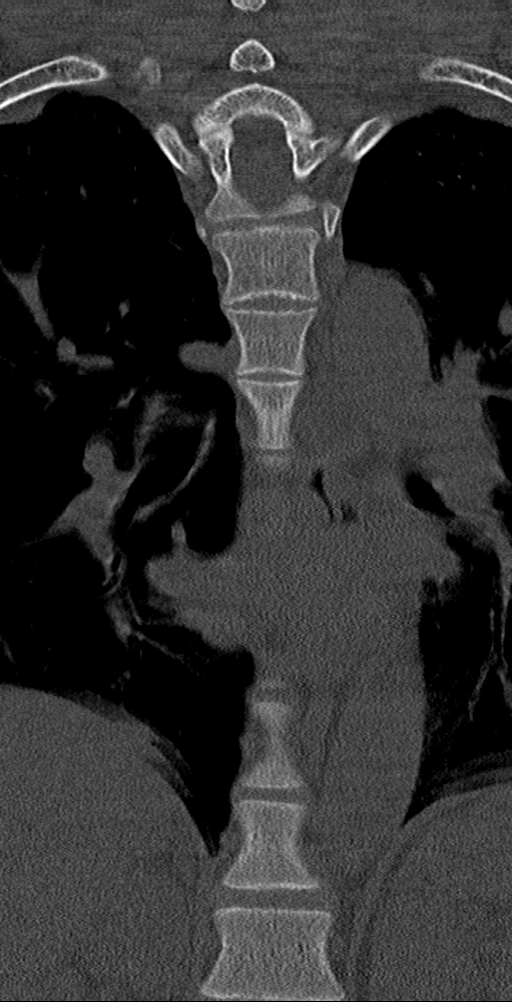
[im 39/65  bone]
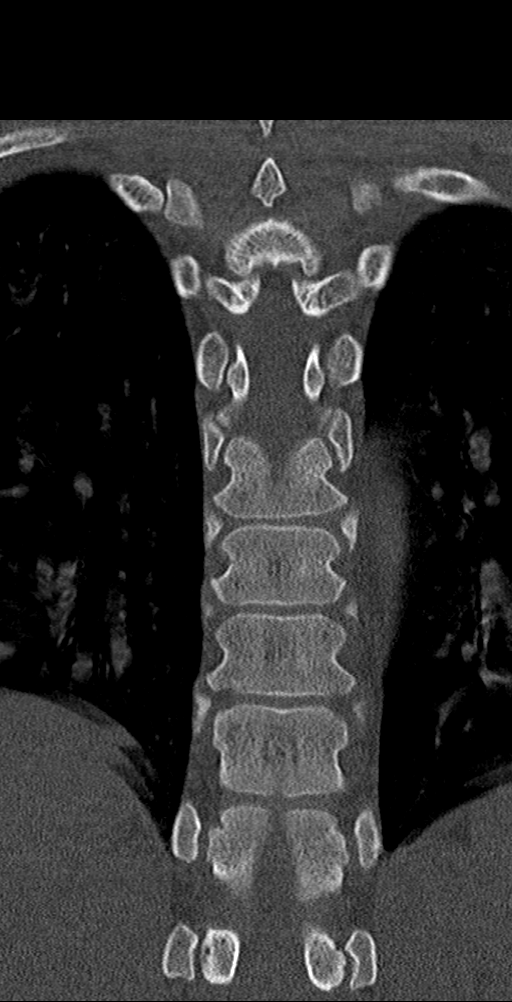

[10 of 33 positions shown; findings below may reference images not displayed]

FINDINGS: Alignment: Normal.

Vertebrae: No acute fracture or focal pathologic process.

Paraspinal and other soft tissues: Negative.

Disc levels: Within normal limits
IMPRESSION: No CT evidence for acute osseous abnormality of the thoracic spine.

## 2024-11-24 ENCOUNTER — Other Ambulatory Visit: Payer: Self-pay

## 2024-11-24 ENCOUNTER — Encounter (HOSPITAL_BASED_OUTPATIENT_CLINIC_OR_DEPARTMENT_OTHER): Payer: Self-pay | Admitting: Emergency Medicine

## 2024-11-24 ENCOUNTER — Emergency Department (HOSPITAL_BASED_OUTPATIENT_CLINIC_OR_DEPARTMENT_OTHER)
Admission: EM | Admit: 2024-11-24 | Discharge: 2024-11-24 | Disposition: A | Discharge status: Home / Self Care | Payer: Self-pay | Attending: Emergency Medicine | Admitting: Emergency Medicine

## 2024-11-24 ENCOUNTER — Emergency Department (HOSPITAL_BASED_OUTPATIENT_CLINIC_OR_DEPARTMENT_OTHER): Payer: Self-pay

## 2024-11-24 DIAGNOSIS — R072 Precordial pain: Secondary | ICD-10-CM | POA: Insufficient documentation

## 2024-11-24 DIAGNOSIS — J45909 Unspecified asthma, uncomplicated: Secondary | ICD-10-CM | POA: Insufficient documentation

## 2024-11-24 DIAGNOSIS — F17290 Nicotine dependence, other tobacco product, uncomplicated: Secondary | ICD-10-CM | POA: Insufficient documentation

## 2024-11-24 HISTORY — DX: Unspecified asthma, uncomplicated: J45.909

## 2024-11-24 LAB — BASIC METABOLIC PANEL WITH GFR
Anion gap: 12 (ref 5–15)
BUN: 7 mg/dL (ref 6–20)
CO2: 24 mmol/L (ref 22–32)
Calcium: 9.2 mg/dL (ref 8.9–10.3)
Chloride: 106 mmol/L (ref 98–111)
Creatinine, Ser: 0.96 mg/dL (ref 0.61–1.24)
GFR, Estimated: >60 mL/min
Glucose, Bld: 90 mg/dL (ref 70–99)
Potassium: 4.2 mmol/L (ref 3.5–5.1)
Sodium: 141 mmol/L (ref 135–145)

## 2024-11-24 LAB — CBC
HCT: 40.7 % (ref 39.0–52.0)
Hemoglobin: 14.2 g/dL (ref 13.0–17.0)
MCH: 33.5 pg (ref 26.0–34.0)
MCHC: 34.9 g/dL (ref 30.0–36.0)
MCV: 96 fL (ref 80.0–100.0)
Platelets: 248 10*3/uL (ref 150–400)
RBC: 4.24 MIL/uL (ref 4.22–5.81)
RDW: 12.7 % (ref 11.5–15.5)
WBC: 4.3 10*3/uL (ref 4.0–10.5)
nRBC: 0 % (ref 0.0–0.2)

## 2024-11-24 LAB — TROPONIN T, HIGH SENSITIVITY: Troponin T High Sensitivity: <6 ng/L (ref 0–19)

## 2024-11-24 MED ORDER — KETOROLAC TROMETHAMINE 15 MG/ML IJ SOLN
15.0000 mg | Freq: Once | INTRAMUSCULAR | Status: AC
  Administered 2024-11-24: 15 mg via INTRAVENOUS
  Filled 2024-11-24: qty 1

## 2024-11-24 NOTE — ED Provider Notes (Signed)
 " [place] [facility]ERGENCY DEPARTMENT AT [facility] Provider Note   CSN: [id number] Arrival date & time: 11/24/24  0445     Patient presents with: Chest Pain   Christopher Brewer is a 44 y.o. male.   The history is provided by the patient and a relative.  Patient with history of asthma presents with chest pain.  Patient reports onset of left-sided chest pain around 2 hours ago while talking with friends and family.  He reports it hurts to breathe, lay flat and move his upper body.  He reports mild shortness of breath.  No fevers, no coughing, no hemoptysis.  No vomiting He has had this previously but unclear the diagnosis.  He thinks he has a cardiac history but is unsure what kind Patient has been evaluated at outside facilities for chest pain previously.  Patient is a current smoker No recent heavy lifting, no trauma   Past Medical History:  Diagnosis Date   Asthma     Prior to Admission medications  Not on File    Allergies: Patient has no known allergies.    Review of Systems  Constitutional:  Negative for fever.  Respiratory:  Positive for shortness of breath. Negative for cough.   Cardiovascular:  Positive for chest pain.  Neurological:  Negative for syncope.    Updated Vital Signs BP (!) 132/90   Pulse 64   Temp 97.7 F (36.5 C) (Oral)   Resp 20   Ht 1.778 m (5' 10)   Wt 70.3 kg   SpO2 100%   BMI 22.24 kg/m   Physical Exam CONSTITUTIONAL: Well developed/well nourished HEAD: Normocephalic/atraumatic ENMT: Mucous membranes moist NECK: supple no meningeal signs CV: S1/S2 noted, no murmurs/rubs/gallops noted LUNGS: Lungs are clear to auscultation bilaterally, no apparent distress Chest-diffuse chest wall tenderness, no bruising, no rash ABDOMEN: soft, nontender NEURO: Pt is awake/alert/appropriate, moves all extremitiesx4.  No facial droop.   EXTREMITIES: pulses normal/equalx4, full ROM No calf tenderness, no edema SKIN: warm, color normal PSYCH:  Anxious (all labs ordered are listed, but only abnormal results are displayed) Labs Reviewed  BASIC METABOLIC PANEL WITH GFR  CBC  TROPONIN T, HIGH SENSITIVITY    EKG: EKG Interpretation Date/Time:  Thursday November 24 2024 04:55:01 EST Ventricular Rate:  65 PR Interval:  151 QRS Duration:  93 QT Interval:  387 QTC Calculation: 403 R Axis:   43  Text Interpretation: Sinus rhythm ST elev, probable normal early repol pattern No previous ECGs available Interpretation limited secondary to artifact Confirmed by Midge Golas ([id number]) on 11/24/2024 4:58:01 AM  Radiology: DG Chest Portable 1 View Result Date: 11/24/2024 EXAM: 1 VIEW(S) XRAY OF THE CHEST 11/24/2024 05:31:22 AM COMPARISON: None available. CLINICAL HISTORY: Chest pain. History of asthma. FINDINGS: LUNGS AND PLEURA: No focal pulmonary opacity. No pleural effusion. No pneumothorax. HEART AND MEDIASTINUM: No acute abnormality of the cardiac and mediastinal silhouettes. BONES AND SOFT TISSUES: No acute osseous abnormality. IMPRESSION: 1. No acute cardiopulmonary process. Electronically signed by: Waddell Calk MD 11/24/2024 06:16 AM EST RP Workstation: [id number]     Procedures   Medications Ordered in the ED  ketorolac  (TORADOL ) 15 MG/ML injection 15 mg (15 mg Intravenous Given 11/24/24 0524)    Clinical Course as of 11/24/24 0631  Thu Nov 24, 2024  0524 Patient presents for chest pain that started earlier tonight.  Patient reports he has had this previously, he is unclear if he actually has a cardiac history.  No documentation of CAD or VTE's are  noted on care everywhere.  Patient is tender to palpation on exam however he is a challenging historian which limits overall evaluation.  Imaging and labs are pending at this time [DW]  0631 Initial workup overall unremarkable On reassessment, patient sleeping in no acute distress and reports his pain is improved Patient and his significant other are requesting immediate discharge  [DW]  0631 Patient overall safe for discharge home.  I have low suspicion for ACS/PE/dissection.  Patient appears PERC negative at this time [DW]    Clinical Course User Index [DW] Midge Golas, MD             HEART Score: 2                    Medical Decision Making Amount and/or Complexity of Data Reviewed Labs: ordered. Radiology: ordered.  Risk Prescription drug management.   This patient presents to the ED for concern of chest pain, this involves an extensive number of treatment options, and is a complaint that carries with it a high risk of complications and morbidity.  The differential diagnosis includes but is not limited to acute coronary syndrome, aortic dissection, pulmonary embolism, pericarditis, pneumothorax, pneumonia, myocarditis, pleurisy, esophageal rupture   Comorbidities that complicate the patient evaluation: Patients presentation is complicated by their history of asthma  Social Determinants of Health: Patients impaired access to primary care and tobacco use, poor health literacy  increases the complexity of managing their presentation  Additional history obtained: Additional history obtained from family Records reviewed Care Everywhere/External Records  Lab Tests: I Ordered, and personally interpreted labs.  The pertinent results include: Labs unremarkable  Imaging Studies ordered: I ordered imaging studies including X-ray chest  I independently visualized and interpreted imaging which showed no acute findings I agree with the radiologist interpretation  Cardiac Monitoring: The patient was maintained on a cardiac monitor.  I personally viewed and interpreted the cardiac monitor which showed an underlying rhythm of:  sinus rhythm  Medicines ordered and prescription drug management: I ordered medication including Toradol  for pain Reevaluation of the patient after these medicines showed that the patient    improved  Test Considered: Patient is  low risk / negative by heart score, therefore do not feel that cardiac admission is indicated. Patient appears PERC negative  Reevaluation: After the interventions noted above, I reevaluated the patient and found that they have :improved  Complexity of problems addressed: Patients presentation is most consistent with  acute presentation with potential threat to life or bodily function  Disposition: After consideration of the diagnostic results and the patients response to treatment,  I feel that the patent would benefit from discharge  .        Final diagnoses:  Precordial pain    ED Discharge Orders     None          Midge Golas, MD 11/24/24 [phone]  "

## 2024-11-24 NOTE — Discharge Instructions (Signed)
 You can take ibuprofen 400 mg by mouth 2-3 times a day for the next week for your pain  SEEK IMMEDIATE MEDICAL ATTENTION IF: You have severe chest pain, especially if the pain is crushing or pressure-like and spreads to the arms, back, neck, or jaw, or if you have sweating, nausea (feeling sick to your stomach), or shortness of breath. THIS IS AN EMERGENCY. Don't wait to see if the pain will go away. Get medical help at once. Call 911 or [phone] (operator). DO NOT drive yourself to the hospital.  Your chest pain gets worse and does not go away with rest.  You have an attack of chest pain lasting longer than usual, despite rest and treatment with the medications your caregiver has prescribed.  You wake from sleep with chest pain or shortness of breath.  You feel dizzy or faint.  You have chest pain not typical of your usual pain for which you originally saw your caregiver.

## 2024-11-24 NOTE — ED Triage Notes (Signed)
 Chest pain X 2 hours, states was talking with friends. Has some cardiac Hx, unsure what kind.
